# Patient Record
Sex: Female | Born: 1995 | Race: Black or African American | Hispanic: No | Marital: Single | Smoking: Never smoker
Health system: Southern US, Community
[De-identification: ages and names within clinical notes are randomized; demographics above are authoritative.]

## PROBLEM LIST (undated history)

## (undated) DIAGNOSIS — A6000 Herpesviral infection of urogenital system, unspecified: Secondary | ICD-10-CM

---

## 2020-07-05 ENCOUNTER — Inpatient Hospital Stay (HOSPITAL_COMMUNITY): Payer: Medicaid - Out of State

## 2020-07-05 ENCOUNTER — Inpatient Hospital Stay (HOSPITAL_COMMUNITY)
Admission: AD | Admit: 2020-07-05 | Discharge: 2020-07-05 | Disposition: A | Discharge status: Home / Self Care | Payer: Medicaid - Out of State | Attending: Family Medicine | Admitting: Family Medicine

## 2020-07-05 ENCOUNTER — Encounter (HOSPITAL_COMMUNITY): Payer: Self-pay | Admitting: Obstetrics and Gynecology

## 2020-07-05 ENCOUNTER — Other Ambulatory Visit: Payer: Self-pay

## 2020-07-05 DIAGNOSIS — Z3687 Encounter for antenatal screening for uncertain dates: Secondary | ICD-10-CM

## 2020-07-05 DIAGNOSIS — N939 Abnormal uterine and vaginal bleeding, unspecified: Secondary | ICD-10-CM

## 2020-07-05 DIAGNOSIS — Z3A01 Less than 8 weeks gestation of pregnancy: Secondary | ICD-10-CM | POA: Insufficient documentation

## 2020-07-05 DIAGNOSIS — Z3490 Encounter for supervision of normal pregnancy, unspecified, unspecified trimester: Secondary | ICD-10-CM

## 2020-07-05 DIAGNOSIS — O209 Hemorrhage in early pregnancy, unspecified: Secondary | ICD-10-CM | POA: Diagnosis not present

## 2020-07-05 DIAGNOSIS — O4691 Antepartum hemorrhage, unspecified, first trimester: Secondary | ICD-10-CM

## 2020-07-05 HISTORY — DX: Herpesviral infection of urogenital system, unspecified: A60.00

## 2020-07-05 LAB — URINALYSIS, ROUTINE W REFLEX MICROSCOPIC
Bilirubin Urine: NEGATIVE
Glucose, UA: 150 mg/dL — AB
Hgb urine dipstick: NEGATIVE
Ketones, ur: NEGATIVE mg/dL
Leukocytes,Ua: NEGATIVE
Nitrite: NEGATIVE
Protein, ur: NEGATIVE mg/dL
Specific Gravity, Urine: 1.008 (ref 1.005–1.030)
pH: 7 (ref 5.0–8.0)

## 2020-07-05 LAB — HCG, QUANTITATIVE, PREGNANCY: hCG, Beta Chain, Quant, S: 25857 m[IU]/mL — ABNORMAL HIGH (ref <5)

## 2020-07-05 LAB — COMPREHENSIVE METABOLIC PANEL
ALT: 15 U/L (ref 0–44)
AST: 17 U/L (ref 15–41)
Albumin: 3.3 g/dL — ABNORMAL LOW (ref 3.5–5.0)
Alkaline Phosphatase: 31 U/L — ABNORMAL LOW (ref 38–126)
Anion gap: 10 (ref 5–15)
BUN: <5 mg/dL — ABNORMAL LOW (ref 6–20)
CO2: 22 mmol/L (ref 22–32)
Calcium: 9.5 mg/dL (ref 8.9–10.3)
Chloride: 105 mmol/L (ref 98–111)
Creatinine, Ser: 0.86 mg/dL (ref 0.44–1.00)
GFR, Estimated: >60 mL/min (ref >60)
Glucose, Bld: 101 mg/dL — ABNORMAL HIGH (ref 70–99)
Potassium: 3.9 mmol/L (ref 3.5–5.1)
Sodium: 137 mmol/L (ref 135–145)
Total Bilirubin: 0.2 mg/dL — ABNORMAL LOW (ref 0.3–1.2)
Total Protein: 6.5 g/dL (ref 6.5–8.1)

## 2020-07-05 LAB — CBC
HCT: 39 % (ref 36.0–46.0)
Hemoglobin: 12.6 g/dL (ref 12.0–15.0)
MCH: 26 pg (ref 26.0–34.0)
MCHC: 32.3 g/dL (ref 30.0–36.0)
MCV: 80.6 fL (ref 80.0–100.0)
Platelets: 253 10*3/uL (ref 150–400)
RBC: 4.84 MIL/uL (ref 3.87–5.11)
RDW: 13.8 % (ref 11.5–15.5)
WBC: 5.3 10*3/uL (ref 4.0–10.5)
nRBC: 0 % (ref 0.0–0.2)

## 2020-07-05 LAB — WET PREP, GENITAL
Clue Cells Wet Prep HPF POC: NONE SEEN
Sperm: NONE SEEN
Trich, Wet Prep: NONE SEEN
Yeast Wet Prep HPF POC: NONE SEEN

## 2020-07-05 LAB — POCT PREGNANCY, URINE: Preg Test, Ur: POSITIVE — AB

## 2020-07-05 MED ORDER — RHO D IMMUNE GLOBULIN 1500 UNIT/2ML IJ SOSY
300.0000 ug | PREFILLED_SYRINGE | Freq: Once | INTRAMUSCULAR | Status: AC
  Administered 2020-07-05: 300 ug via INTRAMUSCULAR
  Filled 2020-07-05: qty 2

## 2020-07-05 NOTE — MAU Provider Note (Signed)
Patient Cathy Barnett is a 24 y.o. G1P0  at [redacted]w[redacted]d here with complaints of vaginal bleeding this morning at 4 am and slight abdominal pain. She had a positive pregnancy test yesterday at urgent care. She has a history of PCOS and last period was in August; she does not know how far along she is.   She denies recent intercourse. She denies dysuria, NV, SOB, fever, extreme bleeding or pain, or other complaint. Was not planning to get pregnant but is happy about the news.  History     CSN: [id number]  Arrival date and time: 07/05/20 1023   None     Chief Complaint  Patient presents with  . Vaginal Bleeding   Vaginal Bleeding The patient's primary symptoms include vaginal bleeding. This is a new problem. The current episode started today. The problem occurs rarely. The problem has been resolved. She is pregnant. Associated symptoms include abdominal pain. Pertinent negatives include no constipation, diarrhea, dysuria, nausea or vomiting. The vaginal discharge was bloody. The vaginal bleeding is spotting. She has not been passing clots. She has not been passing tissue.  Abdominal Pain This is a new problem. The current episode started today. The problem occurs intermittently. The pain is located in the suprapubic region. The pain is at a severity of 3/10. The quality of the pain is cramping. The abdominal pain does not radiate. Pertinent negatives include no constipation, diarrhea, dysuria, nausea or vomiting.    OB History    Gravida  1   Para      Term      Preterm      AB      Living        SAB      TAB      Ectopic      Multiple      Live Births              Past Medical History:  Diagnosis Date  . Herpes genitalia     History reviewed. No pertinent surgical history.  History reviewed. No pertinent family history.  Social History   Tobacco Use  . Smoking status: Never Smoker  . Smokeless tobacco: Never Used  Substance Use Topics  . Alcohol  use: Not Currently    Comment: social  . Drug use: Never    Allergies:  Allergies  Allergen Reactions  . Coconut (Cocos Nucifera) Allergy Skin Test Hives    No medications prior to admission.    Review of Systems  Constitutional: Negative.   HENT: Negative.   Eyes: Negative.   Respiratory: Negative.   Gastrointestinal: Positive for abdominal pain. Negative for constipation, diarrhea, nausea and vomiting.  Genitourinary: Positive for vaginal bleeding. Negative for dysuria.  Allergic/Immunologic: Negative.   Neurological: Negative.   Hematological: Negative.   Psychiatric/Behavioral: Negative.    Physical Exam   Blood pressure 139/72, pulse 99, temperature 98.2 F (36.8 C), resp. rate 16, last menstrual period 06/04/2020.  Physical Exam Constitutional:      Appearance: Normal appearance.  HENT:     Head: Normocephalic.  Cardiovascular:     Pulses: Normal pulses.  Pulmonary:     Effort: Pulmonary effort is normal.  Abdominal:     General: Abdomen is flat.  Genitourinary:    General: Normal vulva.     Vagina: No vaginal discharge.     Rectum: Guaiac result negative.     Comments: NEFG; no blood in the vagina, no discharge, no CMT, no tenderness over adnexa;  cervix is closed, long and thick.  Musculoskeletal:        General: Normal range of motion.  Skin:    General: Skin is warm.  Neurological:     General: No focal deficit present.     Mental Status: She is alert.     MAU Course  Procedures  MDM -given patient's complaint, will do full ectopic work-up, including bHCG, [place], ABO, CBC, CMP -Patient had no bleeding while in MAU.  -wet prep collected> no findings -Blood type is O-, received RHogam while in MAU.  -US SHows that patient is 15 weeks 5 days I have independently reviewed the [place] images, which reveal finding of SIUP with cardiac activity at 15 weeks.   Assessment and Plan    1. Intrauterine pregnancy   2. Vaginal bleeding   3. Encounter for  antenatal screening for uncertain dates    2. Patient stable for discharge; she will follow up with OB in [place], [place].   3. Reviewed bleeding precautions and when to come to MAU.   4. GC pending Charlesetta Garibaldi [facility] 07/05/2020, 11:50 AM

## 2020-07-05 NOTE — MAU Note (Signed)
. °  Cathy Barnett is a 24 y.o. at 101w3d here in MAU reporting: she started having vaginal spotting this morning around 3 this morning. Reports lower abdominal pain. Unsure of LMP due to PCOS LMP: unsure Onset of complaint: today Pain score: 3 Vitals:   07/05/20 1040  BP: 139/72  Pulse: 99  Resp: 16  Temp: 98.2 F (36.8 C)     FHT: Lab orders placed from triage: UA/UPT

## 2020-07-05 NOTE — Discharge Instructions (Signed)
-make app with OB to start prenatal care  Second Trimester of Pregnancy  The second trimester is from week 14 through week 27 (month 4 through 6). This is often the time in pregnancy that you feel your best. Often times, morning sickness has lessened or quit. You may have more energy, and you may get hungry more often. Your unborn baby is growing rapidly. At the end of the sixth month, he or she is about 9 inches long and weighs about 1 pounds. You will likely feel the baby move between 18 and 20 weeks of pregnancy. Follow these instructions at home: Medicines  Take over-the-counter and prescription medicines only as told by your doctor. Some medicines are safe and some medicines are not safe during pregnancy.  Take a prenatal vitamin that contains at least 600 micrograms (mcg) of folic acid.  If you have trouble pooping (constipation), take medicine that will make your stool soft (stool softener) if your doctor approves. Eating and drinking   Eat regular, healthy meals.  Avoid raw meat and uncooked cheese.  If you get low calcium from the food you eat, talk to your doctor about taking a daily calcium supplement.  Avoid foods that are high in fat and sugars, such as fried and sweet foods.  If you feel sick to your stomach (nauseous) or throw up (vomit): ? Eat 4 or 5 small meals a day instead of 3 large meals. ? Try eating a few soda crackers. ? Drink liquids between meals instead of during meals.  To prevent constipation: ? Eat foods that are high in fiber, like fresh fruits and vegetables, whole grains, and beans. ? Drink enough fluids to keep your pee (urine) clear or pale yellow. Activity  Exercise only as told by your doctor. Stop exercising if you start to have cramps.  Do not exercise if it is too hot, too humid, or if you are in a place of great height (high altitude).  Avoid heavy lifting.  Wear low-heeled shoes. Sit and stand up straight.  You can continue to have  sex unless your doctor tells you not to. Relieving pain and discomfort  Wear a good support bra if your breasts are tender.  Take warm water baths (sitz baths) to soothe pain or discomfort caused by hemorrhoids. Use hemorrhoid cream if your doctor approves.  Rest with your legs raised if you have leg cramps or low back pain.  If you develop puffy, bulging veins (varicose veins) in your legs: ? Wear support hose or compression stockings as told by your doctor. ? Raise (elevate) your feet for 15 minutes, 3-4 times a day. ? Limit salt in your food. Prenatal care  Write down your questions. Take them to your prenatal visits.  Keep all your prenatal visits as told by your doctor. This is important. Safety  Wear your seat belt when driving.  Make a list of emergency phone numbers, including numbers for family, friends, the hospital, and police and fire departments. General instructions  Ask your doctor about the right foods to eat or for help finding a counselor, if you need these services.  Ask your doctor about local prenatal classes. Begin classes before month 6 of your pregnancy.  Do not use hot tubs, steam rooms, or saunas.  Do not douche or use tampons or scented sanitary pads.  Do not cross your legs for long periods of time.  Visit your dentist if you have not done so. Use a soft toothbrush to brush  your teeth. Floss gently.  Avoid all smoking, herbs, and alcohol. Avoid drugs that are not approved by your doctor.  Do not use any products that contain nicotine or tobacco, such as cigarettes and e-cigarettes. If you need help quitting, ask your doctor.  Avoid cat litter boxes and soil used by cats. These carry germs that can cause birth defects in the baby and can cause a loss of your baby (miscarriage) or stillbirth. Contact a doctor if:  You have mild cramps or pressure in your lower belly.  You have pain when you pee (urinate).  You have bad smelling fluid coming  from your vagina.  You continue to feel sick to your stomach (nauseous), throw up (vomit), or have watery poop (diarrhea).  You have a nagging pain in your belly area.  You feel dizzy. Get help right away if:  You have a fever.  You are leaking fluid from your vagina.  You have spotting or bleeding from your vagina.  You have severe belly cramping or pain.  You lose or gain weight rapidly.  You have trouble catching your breath and have chest pain.  You notice sudden or extreme puffiness (swelling) of your face, hands, ankles, feet, or legs.  You have not felt the baby move in over an hour.  You have severe headaches that do not go away when you take medicine.  You have trouble seeing. Summary  The second trimester is from week 14 through week 27 (months 4 through 6). This is often the time in pregnancy that you feel your best.  To take care of yourself and your unborn baby, you will need to eat healthy meals, take medicines only if your doctor tells you to do so, and do activities that are safe for you and your baby.  Call your doctor if you get sick or if you notice anything unusual about your pregnancy. Also, call your doctor if you need help with the right food to eat, or if you want to know what activities are safe for you. This information is not intended to replace advice given to you by your health care provider. Make sure you discuss any questions you have with your health care provider. Document Revised: 11/29/2018 Document Reviewed: 09/12/2016 Elsevier Patient Education  2020 [employer].

## 2020-07-06 LAB — RH IG WORKUP (INCLUDES ABO/RH)
ABO/RH(D): O NEG
Antibody Screen: NEGATIVE
Gestational Age(Wks): 15.5
Unit division: 0

## 2020-07-06 LAB — ABO/RH
ABO/RH(D): O NEG
Antibody Screen: NEGATIVE

## 2020-07-06 LAB — GC/CHLAMYDIA PROBE AMP (~~LOC~~) NOT AT ARMC
Chlamydia: NEGATIVE
Comment: NEGATIVE
Comment: NORMAL
Neisseria Gonorrhea: NEGATIVE

## 2021-05-26 IMAGING — US US OB < 14 WEEKS - US OB TV
2 series · 13 of 28 positions shown · non-contrast
Comparison: none

[Series 1: us ob < 14 weeks - us ob tv · 12 of 62 slices shown (1 of 2)]
[im 3/62]
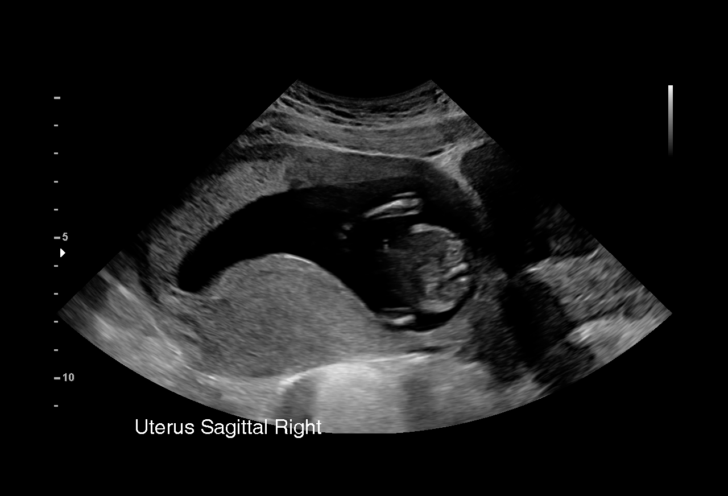
[im 8/62]
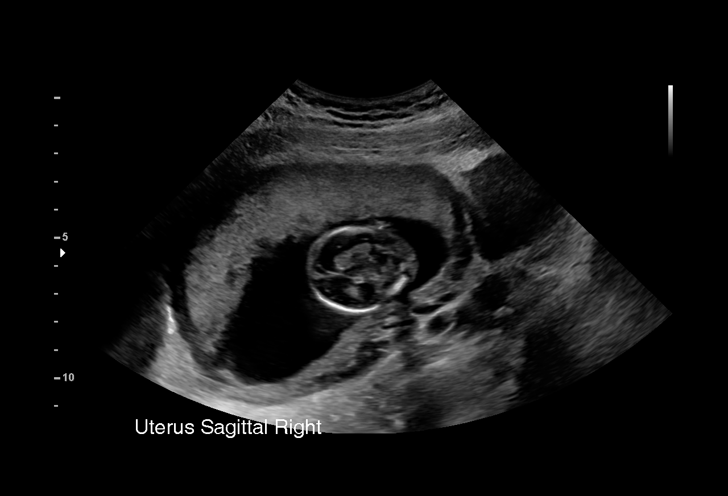
[im 13/62]
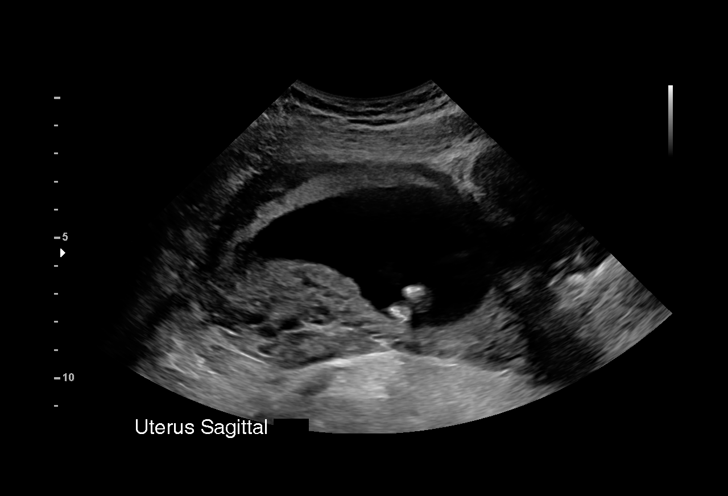
[im 18/62]
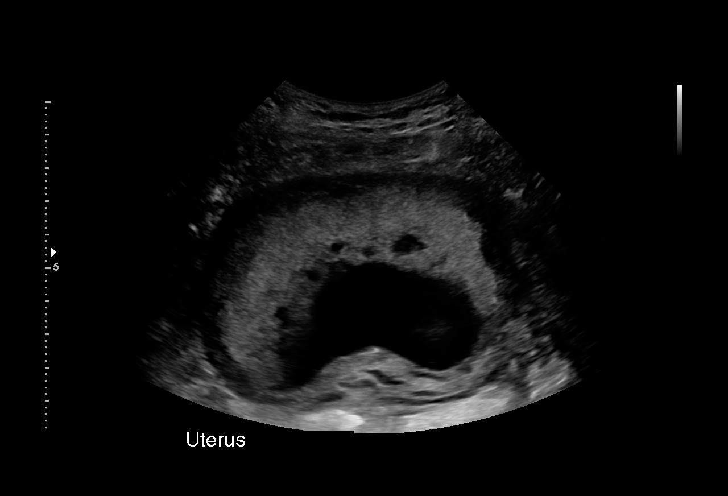
[im 23/62]
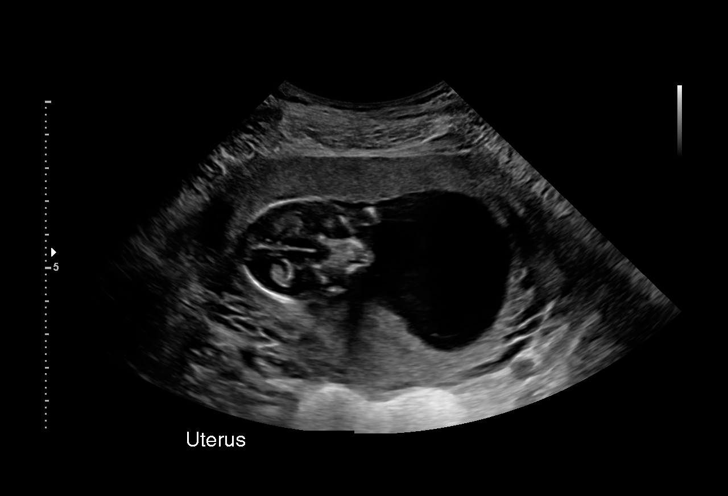
[im 28/62]
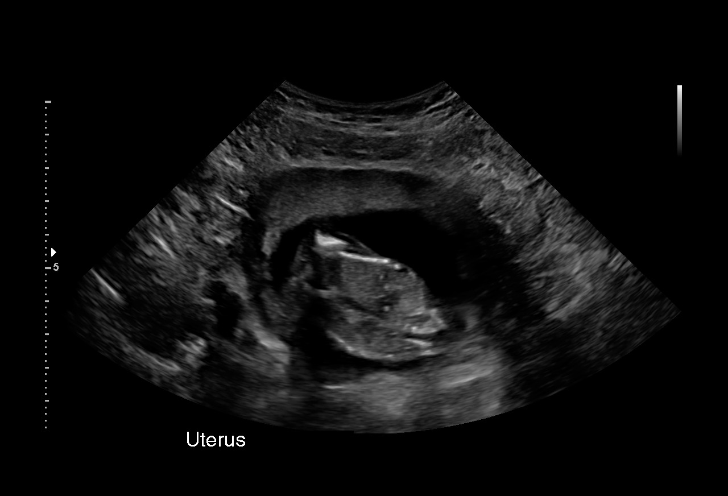
[im 36/62]
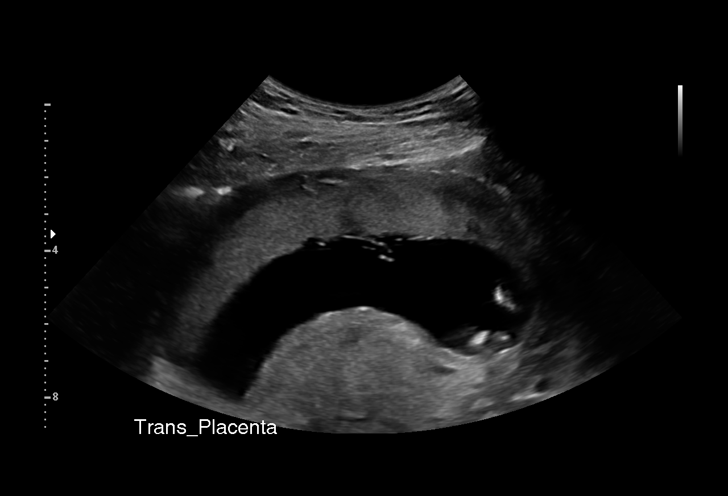
[im 41/62]
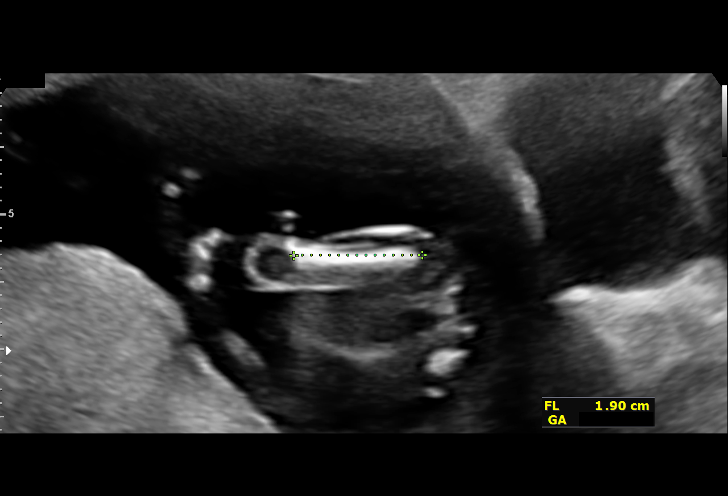
[im 46/62]
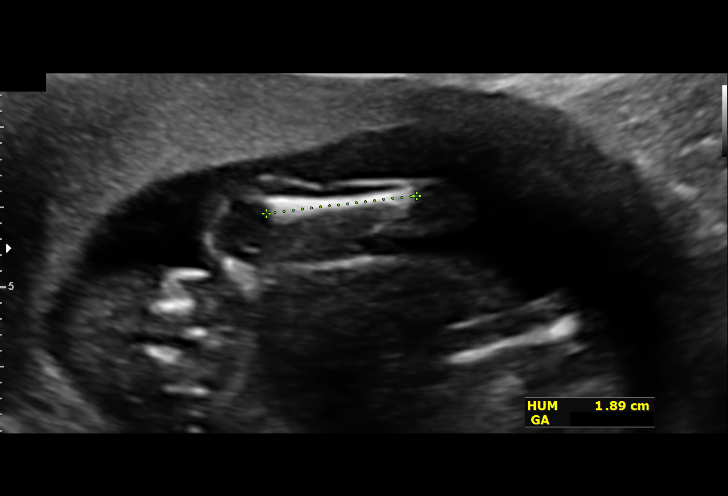
[im 51/62]
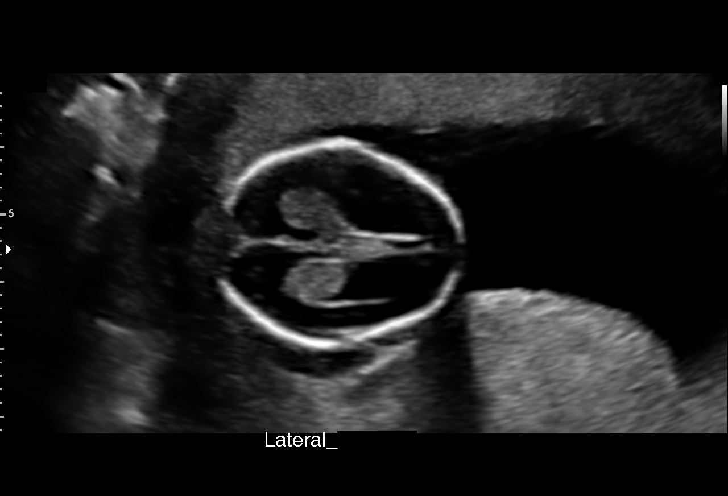
[im 56/62]
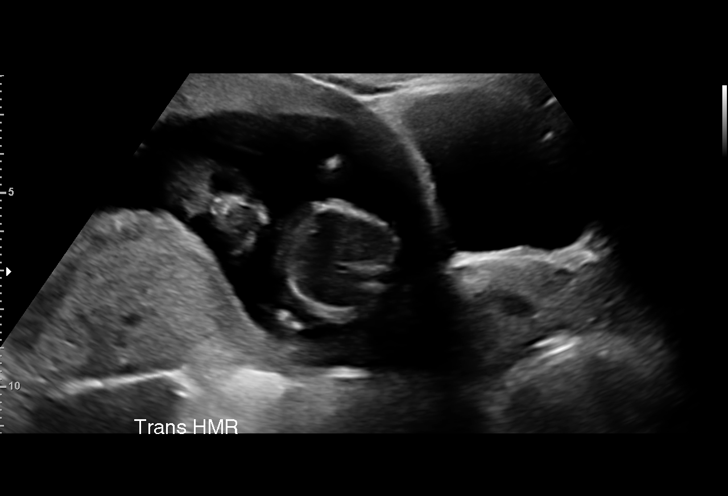
[im 62/62]
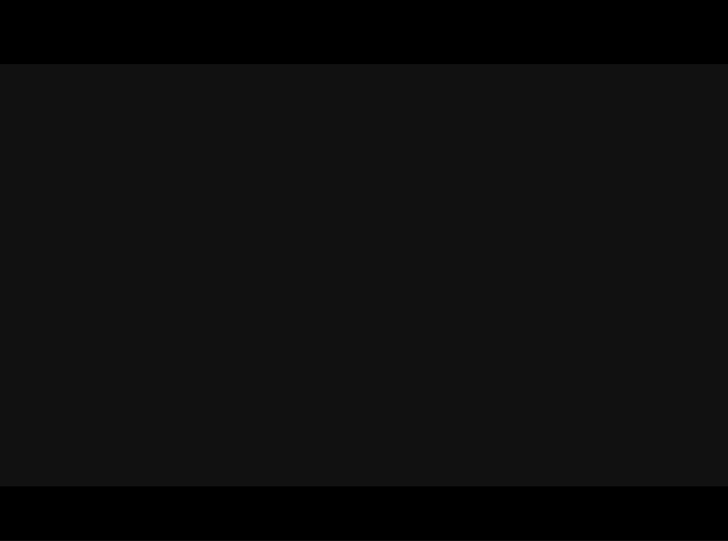

[Series 3: us ob < 14 weeks - us ob tv · 1 of 8 slices shown (2 of 2)]
[im 4/8]
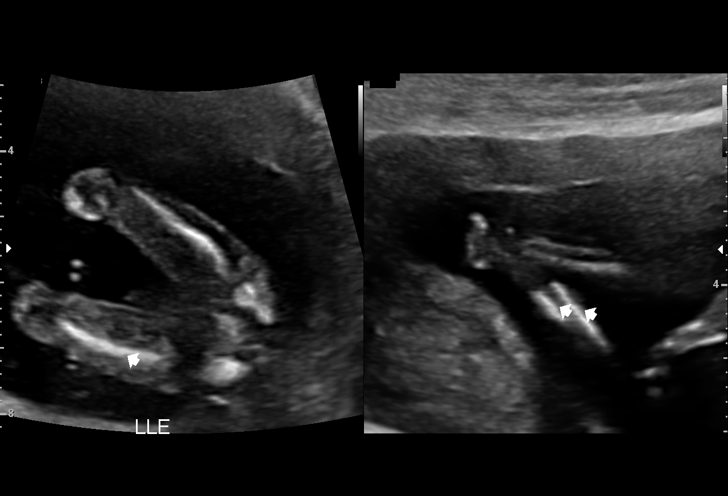

[13 of 28 positions shown; findings below may reference images not displayed]

PAK CHEONG

 Attending:        Jong Hwa Sorel      Secondary Phy.:    [facility]
                                                             [facility] CNM
                                                             [facility] [[facility]]
 Referred By:      [facility] MAU/Triage         [facility]  US MFM OB COMP + 14 WK                [address].01    KRITZEL REVILLA

Indications

 Vaginal bleeding in pregnancy, second
 trimester
 15 weeks gestation of pregnancy
 Encounter for uncertain dates
Fetal Evaluation

 Num Of Fetuses:          1
 Fetal Heart Rate(bpm):   144
 Cardiac Activity:        Observed
 Presentation:            Transverse, head to maternal right
 Placenta:                Anterior
 P. Cord Insertion:       Visualized

 Amniotic Fluid
 AFI FV:      Within normal limits

                             Largest Pocket(cm)

Biometry

 BPD:      30.5  mm     G. Age:  15w 4d         40  %    CI:        71.24   %    70 - 86
                                                         FL/HC:       16.6  %    13.3 -
 HC:      115.1  mm     G. Age:  15w 4d         30  %    HC/AC:       1.15       1.05 -
 AC:       99.8  mm     G. Age:  16w 0d         63  %    FL/BPD:      62.6  %
 FL:       19.1  mm     G. Age:  15w 5d         43  %    FL/AC:       19.1  %    20 - 24
 HUM:      18.7  mm     G. Age:  15w 3d         45  %
 LV:        5.7  mm

 Est. FW:     136   gm     0 lb 5 oz     47  %
OB History

 Gravidity:    1         Term:   0
Gestational Age

 LMP:           4w 3d         Date:  06/04/20                 EDD:   03/11/21
 U/S Today:     15w 5d                                        EDD:   12/22/20
 Best:          15w 5d     Det. By:  U/S (07/05/20)           EDD:   12/22/20
Anatomy

 Cranium:               Appears normal         Aortic Arch:            Not well visualized
 Cavum:                 Appears normal         Ductal Arch:            Not well visualized
 Ventricles:            Appears normal         Diaphragm:              Not well visualized
 Choroid Plexus:        Appears normal         Stomach:                Appears normal, left
                                                                       sided
 Cerebellum:            Not well visualized    Abdomen:                Appears normal
 Posterior Fossa:       Not well visualized    Abdominal Wall:         Not well visualized
 Nuchal Fold:           Not well visualized    Cord Vessels:           Appears normal (3
                                                                       vessel cord)
 Face:                  Orbits nl; profile not Kidneys:                Not well visualized
                        well visualized
 Lips:                  Appears normal         Bladder:                Appears normal
 Thoracic:              Appears normal         Spine:                  Not well visualized
 Heart:                 Not well visualized    Upper Extremities:      Appears normal
 RVOT:                  Not well visualized    Lower Extremities:      Appears normal
 LVOT:                  Not well visualized

 Other:  Technically difficult due to fetal position. Technically difficult due to
         early gestational age.
Cervix Uterus Adnexa

 Cervix
 Length:           3.61  cm.
 Normal appearance by transabdominal scan. Closed
Impression

 Vaginal bleeding in the second trimester with uncertain dates.
 Single intrauterine pregnancy identified. The pregnancy is
 dated by today's management.
 Fetal anatomy is not complete due to early gestation
 There is good fetal movement and amniotic fluid volume.
Recommendations

 Follow up growth in 4 weeks.
